# Patient Record
Sex: Male | Born: 1980 | Race: Black or African American | Hispanic: No | Marital: Single | Smoking: Current every day smoker
Health system: Southern US, Community
[De-identification: ages and names within clinical notes are randomized; demographics above are authoritative.]

## PROBLEM LIST (undated history)

## (undated) DIAGNOSIS — J40 Bronchitis, not specified as acute or chronic: Secondary | ICD-10-CM

## (undated) DIAGNOSIS — A150 Tuberculosis of lung: Secondary | ICD-10-CM

## (undated) HISTORY — PX: TONSILLECTOMY: SUR1361

---

## 2012-05-15 ENCOUNTER — Encounter (HOSPITAL_COMMUNITY): Payer: Self-pay

## 2012-05-15 ENCOUNTER — Emergency Department (HOSPITAL_COMMUNITY)
Admission: EM | Admit: 2012-05-15 | Discharge: 2012-05-15 | Disposition: A | Discharge status: Home / Self Care | Payer: Self-pay | Attending: Emergency Medicine | Admitting: Emergency Medicine

## 2012-05-15 DIAGNOSIS — L0291 Cutaneous abscess, unspecified: Secondary | ICD-10-CM

## 2012-05-15 DIAGNOSIS — L03211 Cellulitis of face: Secondary | ICD-10-CM | POA: Insufficient documentation

## 2012-05-15 DIAGNOSIS — F17200 Nicotine dependence, unspecified, uncomplicated: Secondary | ICD-10-CM | POA: Insufficient documentation

## 2012-05-15 DIAGNOSIS — L0201 Cutaneous abscess of face: Secondary | ICD-10-CM | POA: Insufficient documentation

## 2012-05-15 HISTORY — DX: Bronchitis, not specified as acute or chronic: J40

## 2012-05-15 MED ORDER — ALBUTEROL SULFATE HFA 108 (90 BASE) MCG/ACT IN AERS: 2.0000 | INHALATION_SPRAY | Freq: Once | RESPIRATORY_TRACT | Status: DC

## 2012-05-15 MED ORDER — AEROCHAMBER MAX W/MASK MEDIUM MISC
1.0000 | Freq: Once | Status: DC
  Filled 2012-05-15: qty 1

## 2012-05-15 MED ORDER — OXYCODONE-ACETAMINOPHEN 5-325 MG PO TABS: 1.0000 | ORAL_TABLET | ORAL | Status: AC | PRN

## 2012-05-15 NOTE — ED Notes (Signed)
Dressing applied to face covering packing.   Extra dressing material given for dressing changes.  Pt advised to remove packing in 5 days and to return as needed.

## 2012-05-15 NOTE — ED Provider Notes (Signed)
Medical screening examination/treatment/procedure(s) were performed by non-physician practitioner and as supervising physician I was immediately available for consultation/collaboration.  Flint Melter, MD 05/15/12 2132

## 2012-05-15 NOTE — ED Notes (Signed)
Pt also reports dry skin to bilateral feet, and pain to (L) thumb

## 2012-05-15 NOTE — ED Provider Notes (Signed)
History     CSN: [id number]  Arrival date & time 05/15/12  9528   First MD Initiated Contact with Patient 05/15/12 [phone]      Chief Complaint  Patient presents with  . Abscess    (Consider location/radiation/quality/duration/timing/severity/associated sxs/prior treatment) HPI Comments: Cristian Morton 31 y.o. male   The chief complaint is: Patient presents with:   Abscess    Patient with history of facial abscess presents today with abscess on left face. Patient had a cyst in the area.  He had his face trimmed last week at the barber shop . 4 days ago the cyst began to swell and become hot and red. Pain is throbbing, rates at 7/10, does not radiate.  The patient has poor dentition on that side but denies tooth pain or discharge in the mouth.  Denies difficulty breathin, or swallowing.  Denies trismus, ear pain or sore throat.Denies fevers, chills, myalgias, arthralgias, nausea, vomiting, diarrhea. Denies chest pain or abdominal pain.       The history is provided by the patient. No language interpreter was used.    Past Medical History  Diagnosis Date  . Bronchitis     Past Surgical History  Procedure Date  . Tonsillectomy     History reviewed. No pertinent family history.  History  Substance Use Topics  . Smoking status: Current Everyday Smoker  . Smokeless tobacco: Not on file  . Alcohol Use: Yes      Review of Systems  Constitutional: Negative for fever and chills.  HENT: Positive for facial swelling and dental problem. Negative for ear pain, sore throat, mouth sores, trouble swallowing, neck pain and neck stiffness.   Cardiovascular: Negative for chest pain.  Gastrointestinal: Negative for vomiting and abdominal pain.  Musculoskeletal: Negative for myalgias.  Skin: Positive for wound.    Allergies  Review of patient's allergies indicates no known allergies.  Home Medications  No current outpatient prescriptions on file.  BP 112/68  Pulse 73   Temp 99 F (37.2 C) (Oral)  Resp 18  SpO2 100%  Physical Exam  Nursing note and vitals reviewed. Constitutional: He is oriented to person, place, and time. He appears well-developed and well-nourished. No distress.  HENT:  Head: Normocephalic and atraumatic.  Mouth/Throat: Oropharynx is clear and moist.       5 cm swelling on right side of the face. Mass is warm and red. It is wholly fluctuant without induration into surrounding tissues.  Eyes: Conjunctivae are normal. No scleral icterus.  Neck: Normal range of motion. Neck supple.  Cardiovascular: Normal rate, regular rhythm and normal heart sounds.   Pulmonary/Chest: Effort normal. No respiratory distress.  Abdominal: Soft. He exhibits no distension. There is no tenderness.  Lymphadenopathy:    He has no cervical adenopathy.  Neurological: He is alert and oriented to person, place, and time.  Skin: Skin is warm and dry. He is not diaphoretic.  Psychiatric: His behavior is normal.    ED Course  Procedures (including critical care time) INCISION AND DRAINAGE Performed by: Arthor Captain Consent: Verbal consent obtained. Risks and benefits: risks, benefits and alternatives were discussed Type: abscess  Body area: right face  Anesthesia: local infiltration  Local anesthetic: lidocaine 2% without epinephrine  Anesthetic total: 3 ml  Complexity: complex Blunt dissection to break up loculations  Drainage: purulent, followed by sebaceus material  Drainage amount: 20 ml  Packing material: 1/4 in iodoform gauze  Patient tolerance: Patient tolerated the procedure well with no immediate complications.  BP 112/68  Pulse 73  Temp 99 F (37.2 C) (Oral)  Resp 18  SpO2 100%  Labs Reviewed - No data to display No results found.   1. Abscess       MDM  Patient with right facial abscess. Discussed care with Dr. Effie Shy and the patient. He will keep the packing in for 5 days and then remove.  He is instructed to keep  the area clean and change his dressing daily. Discussed reasons to seek immediate care. Patient expresses understanding and agrees with plan.         Arthor Captain, PA-C 05/15/12 2002

## 2012-05-15 NOTE — ED Notes (Signed)
Pt reports an abcess to inside (R) lower jaw x2 days, hx of the same,

## 2012-10-29 ENCOUNTER — Emergency Department (HOSPITAL_COMMUNITY): Payer: Self-pay

## 2012-10-29 ENCOUNTER — Encounter (HOSPITAL_COMMUNITY): Payer: Self-pay | Admitting: Emergency Medicine

## 2012-10-29 ENCOUNTER — Emergency Department (HOSPITAL_COMMUNITY)
Admission: EM | Admit: 2012-10-29 | Discharge: 2012-10-29 | Disposition: A | Discharge status: Home / Self Care | Payer: Self-pay | Attending: Emergency Medicine | Admitting: Emergency Medicine

## 2012-10-29 DIAGNOSIS — N509 Disorder of male genital organs, unspecified: Secondary | ICD-10-CM | POA: Insufficient documentation

## 2012-10-29 DIAGNOSIS — M549 Dorsalgia, unspecified: Secondary | ICD-10-CM | POA: Insufficient documentation

## 2012-10-29 DIAGNOSIS — Y929 Unspecified place or not applicable: Secondary | ICD-10-CM | POA: Insufficient documentation

## 2012-10-29 DIAGNOSIS — Y939 Activity, unspecified: Secondary | ICD-10-CM | POA: Insufficient documentation

## 2012-10-29 DIAGNOSIS — F17200 Nicotine dependence, unspecified, uncomplicated: Secondary | ICD-10-CM | POA: Insufficient documentation

## 2012-10-29 DIAGNOSIS — Z8709 Personal history of other diseases of the respiratory system: Secondary | ICD-10-CM | POA: Insufficient documentation

## 2012-10-29 DIAGNOSIS — R21 Rash and other nonspecific skin eruption: Secondary | ICD-10-CM | POA: Insufficient documentation

## 2012-10-29 DIAGNOSIS — N4889 Other specified disorders of penis: Secondary | ICD-10-CM

## 2012-10-29 DIAGNOSIS — Z8701 Personal history of pneumonia (recurrent): Secondary | ICD-10-CM | POA: Insufficient documentation

## 2012-10-29 DIAGNOSIS — X58XXXA Exposure to other specified factors, initial encounter: Secondary | ICD-10-CM | POA: Insufficient documentation

## 2012-10-29 DIAGNOSIS — S2092XA Blister (nonthermal) of unspecified parts of thorax, initial encounter: Secondary | ICD-10-CM | POA: Insufficient documentation

## 2012-10-29 DIAGNOSIS — B353 Tinea pedis: Secondary | ICD-10-CM | POA: Insufficient documentation

## 2012-10-29 HISTORY — DX: Tuberculosis of lung: A15.0

## 2012-10-29 LAB — RPR: RPR Ser Ql: NONREACTIVE

## 2012-10-29 LAB — HIV ANTIBODY (ROUTINE TESTING W REFLEX): HIV: NONREACTIVE

## 2012-10-29 MED ORDER — TOLNAFTATE 1 % EX CREA: TOPICAL_CREAM | Freq: Two times a day (BID) | CUTANEOUS | Status: DC

## 2012-10-29 MED ORDER — OXYCODONE-ACETAMINOPHEN 5-325 MG PO TABS
1.0000 | ORAL_TABLET | Freq: Four times a day (QID) | ORAL | Status: DC | PRN
Start: 2012-10-29 — End: 2013-03-26

## 2012-10-29 MED ORDER — SULFAMETHOXAZOLE-TRIMETHOPRIM 800-160 MG PO TABS: 1.0000 | ORAL_TABLET | Freq: Two times a day (BID) | ORAL | Status: AC

## 2012-10-29 NOTE — ED Notes (Signed)
C/o "athlete's feet" on both feet- dry skin on hands, and blister on testicle- dry skin noted on both feet- yellowish drainage between pinky and 4 th toe --  Blister on testicle started yesterday- denies and penile discharge, states has never had one before.
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
## 2012-12-06 ENCOUNTER — Encounter (HOSPITAL_COMMUNITY): Payer: Self-pay | Admitting: Emergency Medicine

## 2012-12-06 ENCOUNTER — Emergency Department (INDEPENDENT_AMBULATORY_CARE_PROVIDER_SITE_OTHER)
Admission: EM | Admit: 2012-12-06 | Discharge: 2012-12-06 | Disposition: A | Discharge status: Home / Self Care | Payer: Self-pay | Source: Home / Self Care | Attending: Emergency Medicine | Admitting: Emergency Medicine

## 2012-12-06 DIAGNOSIS — B86 Scabies: Secondary | ICD-10-CM

## 2012-12-06 MED ORDER — PERMETHRIN 5 % EX CREA
TOPICAL_CREAM | CUTANEOUS | Status: DC
Start: 2012-12-06 — End: 2013-03-26

## 2012-12-06 MED ORDER — HYDROXYZINE HCL 25 MG PO TABS: 25.0000 mg | ORAL_TABLET | Freq: Four times a day (QID) | ORAL | Status: DC
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
## 2012-12-06 NOTE — ED Notes (Signed)
Pt states that he has been using hydrocortisone cream with mild relief in itching.

## 2013-03-26 ENCOUNTER — Emergency Department (HOSPITAL_COMMUNITY): Payer: Self-pay

## 2013-03-26 ENCOUNTER — Encounter (HOSPITAL_COMMUNITY): Payer: Self-pay | Admitting: Emergency Medicine

## 2013-03-26 ENCOUNTER — Emergency Department (HOSPITAL_COMMUNITY)
Admission: EM | Admit: 2013-03-26 | Discharge: 2013-03-26 | Disposition: A | Discharge status: Home / Self Care | Payer: Self-pay | Attending: Emergency Medicine | Admitting: Emergency Medicine

## 2013-03-26 DIAGNOSIS — F17200 Nicotine dependence, unspecified, uncomplicated: Secondary | ICD-10-CM | POA: Insufficient documentation

## 2013-03-26 DIAGNOSIS — M25512 Pain in left shoulder: Secondary | ICD-10-CM

## 2013-03-26 DIAGNOSIS — S4980XA Other specified injuries of shoulder and upper arm, unspecified arm, initial encounter: Secondary | ICD-10-CM | POA: Insufficient documentation

## 2013-03-26 DIAGNOSIS — Z8709 Personal history of other diseases of the respiratory system: Secondary | ICD-10-CM | POA: Insufficient documentation

## 2013-03-26 DIAGNOSIS — S46909A Unspecified injury of unspecified muscle, fascia and tendon at shoulder and upper arm level, unspecified arm, initial encounter: Secondary | ICD-10-CM | POA: Insufficient documentation

## 2013-03-26 DIAGNOSIS — Z8611 Personal history of tuberculosis: Secondary | ICD-10-CM | POA: Insufficient documentation

## 2013-03-26 MED ORDER — METHOCARBAMOL 500 MG PO TABS: 500.0000 mg | ORAL_TABLET | Freq: Four times a day (QID) | ORAL | Status: DC | PRN

## 2013-03-26 MED ORDER — OXYCODONE-ACETAMINOPHEN 5-325 MG PO TABS
1.0000 | ORAL_TABLET | Freq: Once | ORAL | Status: AC
  Administered 2013-03-26: 1 via ORAL
  Filled 2013-03-26: qty 1

## 2013-03-26 MED ORDER — OXYCODONE-ACETAMINOPHEN 5-325 MG PO TABS: 2.0000 | ORAL_TABLET | Freq: Four times a day (QID) | ORAL | Status: DC | PRN

## 2013-03-26 NOTE — ED Notes (Signed)
Pt sts injured left shoulder 3 weeks ago and still having pain; pt sts increased pain with movement
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
## 2013-03-27 NOTE — ED Provider Notes (Signed)
Medical screening examination/treatment/procedure(s) were performed by non-physician practitioner and as supervising physician I was immediately available for consultation/collaboration.  Krystall Kruckenberg L Meilani Edmundson, MD 03/27/13 0050 

## 2013-07-08 ENCOUNTER — Emergency Department (HOSPITAL_COMMUNITY): Payer: Self-pay

## 2013-07-08 ENCOUNTER — Encounter (HOSPITAL_COMMUNITY): Payer: Self-pay

## 2013-07-08 ENCOUNTER — Emergency Department (HOSPITAL_COMMUNITY)
Admission: EM | Admit: 2013-07-08 | Discharge: 2013-07-08 | Disposition: A | Discharge status: Home / Self Care | Payer: Self-pay | Attending: Emergency Medicine | Admitting: Emergency Medicine

## 2013-07-08 DIAGNOSIS — W1789XA Other fall from one level to another, initial encounter: Secondary | ICD-10-CM | POA: Insufficient documentation

## 2013-07-08 DIAGNOSIS — Z8709 Personal history of other diseases of the respiratory system: Secondary | ICD-10-CM | POA: Insufficient documentation

## 2013-07-08 DIAGNOSIS — F17200 Nicotine dependence, unspecified, uncomplicated: Secondary | ICD-10-CM | POA: Insufficient documentation

## 2013-07-08 DIAGNOSIS — S62308A Unspecified fracture of other metacarpal bone, initial encounter for closed fracture: Secondary | ICD-10-CM

## 2013-07-08 DIAGNOSIS — Z8611 Personal history of tuberculosis: Secondary | ICD-10-CM | POA: Insufficient documentation

## 2013-07-08 DIAGNOSIS — Y939 Activity, unspecified: Secondary | ICD-10-CM | POA: Insufficient documentation

## 2013-07-08 DIAGNOSIS — Y92009 Unspecified place in unspecified non-institutional (private) residence as the place of occurrence of the external cause: Secondary | ICD-10-CM | POA: Insufficient documentation

## 2013-07-08 DIAGNOSIS — S62309A Unspecified fracture of unspecified metacarpal bone, initial encounter for closed fracture: Secondary | ICD-10-CM | POA: Insufficient documentation

## 2013-07-08 MED ORDER — OXYCODONE-ACETAMINOPHEN 5-325 MG PO TABS
2.0000 | ORAL_TABLET | Freq: Once | ORAL | Status: AC
  Administered 2013-07-08: 2 via ORAL
  Filled 2013-07-08: qty 2

## 2013-07-08 MED ORDER — OXYCODONE-ACETAMINOPHEN 5-325 MG PO TABS: 1.0000 | ORAL_TABLET | Freq: Four times a day (QID) | ORAL | Status: DC | PRN

## 2013-07-08 NOTE — ED Notes (Signed)
Pt states fell off the porch yesterday, swelling noted to rt hand/fingers, states took ibuprofen with no relief, took somebody's percocet and had some relief

## 2013-07-08 NOTE — ED Provider Notes (Signed)
CSN: [id number]     Arrival date & time 07/08/13  1055 History   First MD Initiated Contact with Patient 07/08/13 1110     Chief Complaint  Patient presents with  . Hand Pain   (Consider location/radiation/quality/duration/timing/severity/associated sxs/prior Treatment) HPI Comments: Patient presents emergency department with chief complaint of right hand pain. He states that he had a mechanical fall off his front porch. He states that he landed on his right hand. He endorses moderate to severe pain in his hand. He states that he tried taking ibuprofen, and his son's Percocet with some relief. She's also tried using warm compresses with some relief. States it hurts to bend his fingers.  The history is provided by the patient. No language interpreter was used.    Past Medical History  Diagnosis Date  . Bronchitis   . TB (pulmonary tuberculosis)     treated when was 32 y/o    Past Surgical History  Procedure Laterality Date  . Tonsillectomy     No family history on file. History  Substance Use Topics  . Smoking status: Current Every Day Smoker -- 0.50 packs/day  . Smokeless tobacco: Not on file  . Alcohol Use: Yes     Comment: 3 x/week    Review of Systems  All other systems reviewed and are negative.    Allergies  Review of patient's allergies indicates no known allergies.  Home Medications   Current Outpatient Rx  Name  Route  Sig  Dispense  Refill  . methocarbamol (ROBAXIN) 500 MG tablet   Oral   Take 1 tablet (500 mg total) by mouth 4 (four) times daily as needed.   20 tablet   0   . oxyCODONE-acetaminophen (PERCOCET/ROXICET) 5-325 MG per tablet   Oral   Take 2 tablets by mouth every 6 (six) hours as needed for pain (for severe pain).   5 tablet   0    BP 119/82  Pulse 64  Temp(Src) 99.3 F (37.4 C) (Oral)  Resp 20  SpO2 100% Physical Exam  Nursing note and vitals reviewed. Constitutional: He is oriented to person, place, and time. He appears  well-developed and well-nourished.  HENT:  Head: Normocephalic and atraumatic.  Eyes: Conjunctivae and EOM are normal.  Neck: Normal range of motion.  Cardiovascular: Normal rate and intact distal pulses.   Brisk capillary refill  Pulmonary/Chest: Effort normal.  Abdominal: He exhibits no distension.  Musculoskeletal: Normal range of motion.   Pain to palpation over the fourth and fifth metacarpals, as well as associated moderate swelling, no obvious bony abnormality or deformity  Neurological: He is alert and oriented to person, place, and time.  Skin: Skin is dry.  Psychiatric: He has a normal mood and affect. His behavior is normal. Judgment and thought content normal.    ED Course  Procedures (including critical care time) Labs Review Labs Reviewed - No data to display Imaging Review No results found.  MDM   1. Closed fracture of 5th metacarpal, initial encounter     Patient with right hand pain. Will check x-ray, and will reevaluate.  Patient with boxers fracture. Discussed patient with Dr. Rubin Payor, who recommends hand consultation. Discussed patient with Dr. Janee Morn from [facility] orthopedic, who recommends splinting, pain medicine and followup in his office. Discussed this with the patient, who understands and agrees with the plan. He is stable and ready for discharge.    Roxy Horseman, PA-C 07/08/13 [phone]

## 2013-07-08 NOTE — ED Provider Notes (Signed)
Medical screening examination/treatment/procedure(s) were performed by non-physician practitioner and as supervising physician I was immediately available for consultation/collaboration.  Brennley Curtice R. Fonnie Crookshanks, MD 07/08/13 1611 

## 2013-07-08 NOTE — Progress Notes (Signed)
[facility] provided pt with a [facility] [employer] Card application.  °

## 2014-01-18 ENCOUNTER — Emergency Department (HOSPITAL_COMMUNITY): Payer: Self-pay

## 2014-01-18 ENCOUNTER — Encounter (HOSPITAL_COMMUNITY): Payer: Self-pay | Admitting: Emergency Medicine

## 2014-01-18 ENCOUNTER — Emergency Department (HOSPITAL_COMMUNITY)
Admission: EM | Admit: 2014-01-18 | Discharge: 2014-01-18 | Disposition: A | Discharge status: Home / Self Care | Payer: Self-pay | Attending: Emergency Medicine | Admitting: Emergency Medicine

## 2014-01-18 DIAGNOSIS — IMO0002 Reserved for concepts with insufficient information to code with codable children: Secondary | ICD-10-CM | POA: Insufficient documentation

## 2014-01-18 DIAGNOSIS — W108XXA Fall (on) (from) other stairs and steps, initial encounter: Secondary | ICD-10-CM | POA: Insufficient documentation

## 2014-01-18 DIAGNOSIS — Z8611 Personal history of tuberculosis: Secondary | ICD-10-CM | POA: Insufficient documentation

## 2014-01-18 DIAGNOSIS — M549 Dorsalgia, unspecified: Secondary | ICD-10-CM

## 2014-01-18 DIAGNOSIS — Y929 Unspecified place or not applicable: Secondary | ICD-10-CM | POA: Insufficient documentation

## 2014-01-18 DIAGNOSIS — Z9889 Other specified postprocedural states: Secondary | ICD-10-CM | POA: Insufficient documentation

## 2014-01-18 DIAGNOSIS — S6990XA Unspecified injury of unspecified wrist, hand and finger(s), initial encounter: Secondary | ICD-10-CM | POA: Insufficient documentation

## 2014-01-18 DIAGNOSIS — Y9389 Activity, other specified: Secondary | ICD-10-CM | POA: Insufficient documentation

## 2014-01-18 DIAGNOSIS — Z8709 Personal history of other diseases of the respiratory system: Secondary | ICD-10-CM | POA: Insufficient documentation

## 2014-01-18 DIAGNOSIS — G8929 Other chronic pain: Secondary | ICD-10-CM | POA: Insufficient documentation

## 2014-01-18 DIAGNOSIS — F17200 Nicotine dependence, unspecified, uncomplicated: Secondary | ICD-10-CM | POA: Insufficient documentation

## 2014-01-18 DIAGNOSIS — R202 Paresthesia of skin: Secondary | ICD-10-CM

## 2014-01-18 MED ORDER — IBUPROFEN 400 MG PO TABS
800.0000 mg | ORAL_TABLET | Freq: Once | ORAL | Status: AC
  Administered 2014-01-18: 800 mg via ORAL
  Filled 2014-01-18: qty 2

## 2014-01-18 MED ORDER — CYCLOBENZAPRINE HCL 10 MG PO TABS: 10.0000 mg | ORAL_TABLET | Freq: Three times a day (TID) | ORAL | Status: DC | PRN

## 2014-01-18 MED ORDER — IBUPROFEN 800 MG PO TABS: 800.0000 mg | ORAL_TABLET | Freq: Three times a day (TID) | ORAL | Status: DC | PRN
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
## 2014-01-18 NOTE — Discharge Instructions (Signed)
Read the information below.  Use the prescribed medication as directed.  Please discuss all new medications with your pharmacist.  You may return to the Emergency Department at any time for worsening condition or any new symptoms that concern you.   If you develop fevers, loss of control of bowel or bladder, weakness or numbness in your legs, or are unable to walk, return to the ER for a recheck.    Back Exercises Back exercises help treat and prevent back injuries. The goal of back exercises is to increase the strength of your abdominal and back muscles and the flexibility of your back. These exercises should be started when you no longer have back pain. Back exercises include:  Pelvic Tilt. Lie on your back with your knees bent. Tilt your pelvis until the lower part of your back is against the floor. Hold this position 5 to 10 sec and repeat 5 to 10 times.  Knee to Chest. Pull first 1 knee up against your chest and hold for 20 to 30 seconds, repeat this with the other knee, and then both knees. This may be done with the other leg straight or bent, whichever feels better.  Sit-Ups or Curl-Ups. Bend your knees 90 degrees. Start with tilting your pelvis, and do a partial, slow sit-up, lifting your trunk only 30 to 45 degrees off the floor. Take at least 2 to 3 seconds for each sit-up. Do not do sit-ups with your knees out straight. If partial sit-ups are difficult, simply do the above but with only tightening your abdominal muscles and holding it as directed.  Hip-Lift. Lie on your back with your knees flexed 90 degrees. Push down with your feet and shoulders as you raise your hips a couple inches off the floor; hold for 10 seconds, repeat 5 to 10 times.  Back arches. Lie on your stomach, propping yourself up on bent elbows. Slowly press on your hands, causing an arch in your low back. Repeat 3 to 5 times. Any initial stiffness and discomfort should lessen with repetition over time.  Shoulder-Lifts.  Lie face down with arms beside your body. Keep hips and torso pressed to floor as you slowly lift your head and shoulders off the floor. Do not overdo your exercises, especially in the beginning. Exercises may cause you some mild back discomfort which lasts for a few minutes; however, if the pain is more severe, or lasts for more than 15 minutes, do not continue exercises until you see your caregiver. Improvement with exercise therapy for back problems is slow.  See your caregivers for assistance with developing a proper back exercise program. Document Released: 11/01/2004 Document Revised: 12/17/2011 Document Reviewed: 07/26/2011 [facility] Patient Information 2014 [place], [place].  Back Pain, Adult Low back pain is very common. About 1 in 5 people have back pain.The cause of low back pain is rarely dangerous. The pain often gets better over time.About half of people with a sudden onset of back pain feel better in just 2 weeks. About 8 in 10 people feel better by 6 weeks.  CAUSES Some common causes of back pain include:  Strain of the muscles or ligaments supporting the spine.  Wear and tear (degeneration) of the spinal discs.  Arthritis.  Direct injury to the back. DIAGNOSIS Most of the time, the direct cause of low back pain is not known.However, back pain can be treated effectively even when the exact cause of the pain is unknown.Answering your caregiver's questions about your overall health and  symptoms is one of the most accurate ways to make sure the cause of your pain is not dangerous. If your caregiver needs more information, he or she may order lab work or imaging tests (X-rays or MRIs).However, even if imaging tests show changes in your back, this usually does not require surgery. HOME CARE INSTRUCTIONS For many people, back pain returns.Since low back pain is rarely dangerous, it is often a condition that people can learn to [facility] their own.   Remain active. It is stressful  on the back to sit or stand in one place. Do not sit, drive, or stand in one place for more than 30 minutes at a time. Take short walks on level surfaces as soon as pain allows.Try to increase the length of time you walk each day.  Do not stay in bed.Resting more than 1 or 2 days can delay your recovery.  Do not avoid exercise or work.Your body is made to move.It is not dangerous to be active, even though your back may hurt.Your back will likely heal faster if you return to being active before your pain is gone.  Pay attention to your body when you bend and lift. Many people have less discomfortwhen lifting if they bend their knees, keep the load close to their bodies,and avoid twisting. Often, the most comfortable positions are those that put less stress on your recovering back.  Find a comfortable position to sleep. Use a firm mattress and lie on your side with your knees slightly bent. If you lie on your back, put a pillow under your knees.  Only take over-the-counter or prescription medicines as directed by your caregiver. Over-the-counter medicines to reduce pain and inflammation are often the most helpful.Your caregiver may prescribe muscle relaxant drugs.These medicines help dull your pain so you can more quickly return to your normal activities and healthy exercise.  Put ice on the injured area.  Put ice in a plastic bag.  Place a towel between your skin and the bag.  Leave the ice on for 15-20 minutes, 03-04 times a day for the first 2 to 3 days. After that, ice and heat may be alternated to reduce pain and spasms.  Ask your caregiver about trying back exercises and gentle massage. This may be of some benefit.  Avoid feeling anxious or stressed.Stress increases muscle tension and can worsen back pain.It is important to recognize when you are anxious or stressed and learn ways to manage it.Exercise is a great option. SEEK MEDICAL CARE IF:  You have pain that is not  relieved with rest or medicine.  You have pain that does not improve in 1 week.  You have new symptoms.  You are generally not feeling well. SEEK IMMEDIATE MEDICAL CARE IF:   You have pain that radiates from your back into your legs.  You develop new bowel or bladder control problems.  You have unusual weakness or numbness in your arms or legs.  You develop nausea or vomiting.  You develop abdominal pain.  You feel faint. Document Released: 09/24/2005 Document Revised: 03/25/2012 Document Reviewed: 02/12/2011 [facility] Patient Information 2014 [place], [place].  Chronic Pain Chronic pain can be defined as pain that is off and on and lasts for 3 6 months or longer. Many things cause chronic pain, which can make it difficult to make a diagnosis. There are many treatment options available for chronic pain. However, finding a treatment that works well for you may require trying various approaches until the right one  is found. Many people benefit from a combination of two or more types of treatment to control their pain. SYMPTOMS  Chronic pain can occur anywhere in the body and can range from mild to very severe. Some types of chronic pain include:  Headache.  Low back pain.  Cancer pain.  Arthritis pain.  Neurogenic pain. This is pain resulting from damage to nerves. People with chronic pain may also have other symptoms such as:  Depression.  Anger.  Insomnia.  Anxiety. DIAGNOSIS  Your health care provider will help diagnose your condition over time. In many cases, the initial focus will be on excluding possible conditions that could be causing the pain. Depending on your symptoms, your health care provider may order tests to diagnose your condition. Some of these tests may include:   Blood tests.   CT scan.   MRI.   X-rays.   Ultrasounds.   Nerve conduction studies.  You may need to see a specialist.  TREATMENT  Finding treatment that works well may  take time. You may be referred to a pain specialist. He or she may prescribe medicine or therapies, such as:   Mindful meditation or yoga.  Shots (injections) of numbing or pain-relieving medicines into the spine or area of pain.  Local electrical stimulation.  Acupuncture.   Massage therapy.   Aroma, color, light, or sound therapy.   Biofeedback.   Working with a physical therapist to keep from getting stiff.   Regular, gentle exercise.   Cognitive or behavioral therapy.   Group support.  Sometimes, surgery may be recommended.  HOME CARE INSTRUCTIONS   Take all medicines as directed by your health care provider.   Lessen stress in your life by relaxing and doing things such as listening to calming music.   Exercise or be active as directed by your health care provider.   Eat a healthy diet and include things such as vegetables, fruits, fish, and lean meats in your diet.   Keep all follow-up appointments with your health care provider.   Attend a support group with others suffering from chronic pain. SEEK MEDICAL CARE IF:   Your pain gets worse.   You develop a new pain that was not there before.   You cannot tolerate medicines given to you by your health care provider.   You have new symptoms since your last visit with your health care provider.  SEEK IMMEDIATE MEDICAL CARE IF:   You feel weak.   You have decreased sensation or numbness.   You lose control of bowel or bladder function.   Your pain suddenly gets much worse.   You develop shaking.  You develop chills.  You develop confusion.  You develop chest pain.  You develop shortness of breath.  MAKE SURE YOU:  Understand these instructions.  Will watch your condition.  Will get help right away if you are not doing well or get worse. Document Released: 06/16/2002 Document Revised: 05/27/2013 Document Reviewed: 03/20/2013 [facility] Patient Information 2014 [place],  [place].  Paresthesia Paresthesia is an abnormal burning or prickling sensation. This sensation is generally felt in the hands, arms, legs, or feet. However, it may occur in any part of the body. It is usually not painful. The feeling may be described as:  Tingling or numbness.  "Pins and needles."  Skin crawling.  Buzzing.  Limbs "falling asleep."  Itching. Most people experience temporary (transient) paresthesia at some time in their lives. CAUSES  Paresthesia may occur when you breathe  too quickly (hyperventilation). It can also occur without any apparent cause. Commonly, paresthesia occurs when pressure is placed on a nerve. The feeling quickly goes away once the pressure is removed. For some people, however, paresthesia is a long-lasting (chronic) condition caused by an underlying disorder. The underlying disorder may be:  A traumatic, direct injury to nerves. Examples include a:  Broken (fractured) neck.  Fractured skull.  A disorder affecting the brain and spinal cord (central nervous system). Examples include:  Transverse myelitis.  Encephalitis.  Transient ischemic attack.  Multiple sclerosis.  Stroke.  Tumor or blood vessel problems, such as an arteriovenous malformation pressing against the brain or spinal cord.  A condition that damages the peripheral nerves (peripheral neuropathy). Peripheral nerves are not part of the brain and spinal cord. These conditions include:  Diabetes.  Peripheral vascular disease.  Nerve entrapment syndromes, such as carpal tunnel syndrome.  Shingles.  Hypothyroidism.  Vitamin B12 deficiencies.  Alcoholism.  Heavy metal poisoning (lead, arsenic).  Rheumatoid arthritis.  Systemic lupus erythematosus. DIAGNOSIS  Your caregiver will attempt to find the underlying cause of your paresthesia. Your caregiver may:  Take your medical history.  Perform a physical exam.  Order various lab tests.  Order imaging  tests. TREATMENT  Treatment for paresthesia depends on the underlying cause. HOME CARE INSTRUCTIONS  Avoid drinking alcohol.  You may consider massage or acupuncture to help relieve your symptoms.  Keep all follow-up appointments as directed by your caregiver. SEEK IMMEDIATE MEDICAL CARE IF:   You feel weak.  You have trouble walking or moving.  You have problems with speech or vision.  You feel confused.  You cannot control your bladder or bowel movements.  You feel numbness after an injury.  You faint.  Your burning or prickling feeling gets worse when walking.  You have pain, cramps, or dizziness.  You develop a rash. MAKE SURE YOU:  Understand these instructions.  Will watch your condition.  Will get help right away if you are not doing well or get worse. Document Released: 09/14/2002 Document Revised: 12/17/2011 Document Reviewed: 06/15/2011 [facility] Patient Information 2014 [place], [place].    Emergency Department Resource Guide 1) Find a Doctor and Pay Out of Pocket Although you won't have to find out who is covered by your insurance plan, it is a good idea to ask around and get recommendations. You will then need to call the office and see if the doctor you have chosen will accept you as a new patient and what types of options they offer for patients who are self-pay. Some doctors offer discounts or will set up payment plans for their patients who do not have insurance, but you will need to ask so you aren't surprised when you get to your appointment.  2) Contact Your Local Health Department Not all health departments have doctors that can see patients for sick visits, but many do, so it is worth a call to see if yours does. If you don't know where your local health department is, you can check in your phone book. The CDC also has a tool to help you locate your state's health department, and many state websites also have listings of all of their local health  departments.  3) Find a Walk-in Clinic If your illness is not likely to be very severe or complicated, you may want to try a walk in clinic. These are popping up all over the country in pharmacies, drugstores, and shopping centers. They're usually staffed by nurse  practitioners or physician assistants that have been trained to treat common illnesses and complaints. They're usually fairly quick and inexpensive. However, if you have serious medical issues or chronic medical problems, these are probably not your best option.  No Primary Care Doctor: - Call Health Connect at  [phone] - they can help you locate a primary care doctor that  accepts your insurance, provides certain services, etc. - Physician Referral Service- [phone]  Chronic Pain Problems: Organization         Address  Phone   Notes  [facility]  [phone] Patients need to be referred by their primary care doctor.   Medication Assistance: Organization         Address  Phone   Notes  [facility] Medication [facility] [address] [place] [place], [place] [address] [phone] --Must be a resident of [facility] -- Must have NO insurance coverage whatsoever (no Medicaid/ Medicare, etc.) -- The pt. MUST have a primary care doctor that directs their care regularly and follows them in the community   [facility]  [phone]   [employer]  [phone]    Agencies that provide inexpensive medical care: Organization         Address  Phone   Notes  [facility]  [phone]   [facility]    [phone]   [facility] [address] [place], [place] [address] [phone]   [facility] [address] [place], [place] [phone]   Planned Parenthood    [phone]   [facility]    [phone]   [facility]  [address], [place] Phone:  [phone], Fax:  [phone] Hours of Operation:  9 am - 6 pm, M-F.  Also accepts Medicaid/Medicare and self-pay.  [facility]  [address], [place] Phone: [phone], Fax: [phone]. Hours of Operation:  8:30 am - 5:30 pm, M-F.  Also accepts Medicaid and self-pay.  [facility] [place] [address] [place] Phone: [phone]   [facility] [address] Natasha Bence [place], [place] [phone], Ext. [phone] Mondays & Thursdays: 7-9 AM.  First 15 patients are seen on a first come, first serve basis.    Medicaid-accepting [facility] Providers:  Organization         Address  Phone   Notes  [facility] [address], [place] [phone] Also accepts self-pay patients.  [facility] [address] Laurell Josephs [place], [place]  [phone]   [facility] [address], [place] [phone]   [facility] [address] [place] [phone]   Renaye Rakers [address], [place]   [phone] Only accepts [place] Access [place] patients after they have their name applied to their card.   Self-Pay (no insurance) in [facility]:  Organization         Address  Phone   Notes  Sickle Cell Patients, [facility] [address] [place], [place] [phone]   [facility] [address] [place], [place] [phone]   [facility] [place]  [address], [place] [phone]   [facility]/Dr. Julio Sicks  [phone]  [place], [place] or [address], [place] [phone] Phone number for both [place] and [place] locations is the same.  [facility] [address] [place] [phone]   [facility] [place] [facility] [address], [place] or [address] [phone]   [facility] [address], [place] [phone], phone; [phone], fax Sees patients 1st and 3rd Saturday of every month.  Must not qualify for public or private insurance (i.e. Medicaid, Medicare, [place] Health Choice, Veterans' Benefits)  Household income should be no more than 200% of the poverty level The clinic cannot treat you if you are pregnant or think you are pregnant  Sexually transmitted diseases are not treated at the clinic.    Dental Care: Organization         Address  Phone  Notes  [facility] Department of [facility] [address] [place], [place] [phone] Accepts children up to age 72 who are enrolled in [place] or [place] Health Choice; pregnant women with a Medicaid card; and children who have applied for Medicaid or [place] Health Choice, but were declined, whose parents can pay a reduced fee at time of service.  [facility]  [address], [place] [phone] Accepts children up to age 69 who are enrolled in [place] or [place] Health Choice; pregnant women with a Medicaid card; and children who have applied for Medicaid or [place] Health Choice, but were declined, whose parents can pay a reduced fee at time of service.  [facility]  [address] [place], [place] [phone] Patients are seen by appointment only. Walk-ins are not accepted. [facility] will see patients 11 years of age and older. Monday - Tuesday (8am-5pm) Most Wednesdays (8:30-5pm) $30 per visit, cash only  [facility]  [address], [facility] [phone] Patients are seen by appointment only. Walk-ins are not accepted. [facility] will see patients 82 years of age and older. One Wednesday Evening (Monthly: Volunteer Based).  $30 per visit, cash only  [employer]  [phone] for adults;  Children under age 74, call Graduate Pediatric Dentistry at [phone]. Children aged 47-14, please call [phone] to request a pediatric application.  Dental services are provided in all areas of dental care including fillings, crowns and bridges, complete and partial dentures, implants, gum treatment, root canals, and extractions. Preventive care is also provided. Treatment is provided to both adults and children. Patients are selected via a lottery and there is often a waiting list.   [facility] [place] [address], [place]  [phone] [email or web address]   [facility] [address] [place], [place] [phone], Ext. [phone] Second and Fourth Thursday of each month, opens at 6:30 AM; Clinic ends at 9 AM.  Patients are seen on a first-come first-served basis, and a limited number are seen during each clinic.   [facility]  [address] Ether Griffins [place], [place] [phone]   Eligibility Requirements You must have lived in [place], [place], or [place] counties for at least the last three months.   You cannot be eligible for state or federal sponsored [place], including [employer], [place], or [employer].   You generally cannot be eligible for healthcare insurance through your employer.  How to apply: Eligibility screenings are held every Tuesday and Wednesday afternoon from 1:00 pm until 4:00 pm. You do not need an appointment for the interview!  [facility] [address] [place], [place] [phone]   [facility] Health Department  [phone]   [facility] Health Department  [phone]   [facility] Health Department  [phone]    Behavioral Health Resources in the Community: Intensive Outpatient Programs Organization         Address  Phone  Notes  [facility] Services [address], [place], [place] [phone]   [facility] Outpatient [address] [place], [place] [phone]   [facility] [address] [place], [place]  [phone]   [facility] Mental Health [address],  [place], [place] [phone] or [phone]   Substance Abuse Resources Organization         Address  Phone  Notes  [facility]  [phone]   [facility]  [phone]   [facility] [place]  [phone]   Floydene Flock  [phone]   Residential & Outpatient Substance Abuse Program  [phone]   Psychological Services Organization         Address  Phone  Notes  [facility] Behavioral Health  [phone]   [facility] Services  [phone]   [facility] [address], [place] [phone] or [phone]    Mobile Crisis Teams Organization         Address  Phone  Notes  [facility], Mobile Crisis Care Unit  [phone]   [facility]  [address] [place], [place] [phone]   Doristine Locks [address] [place] [phone]    Self-Help/Support Groups Organization         Address  Phone             Notes  [facility] [place] - variety of support groups  336- I7437963 Call for more information  Narcotics Anonymous (NA), [facility] [address], [employer] [place] Gayatri [place]  2 meetings at this location   Statistician         Address  Phone  Notes  [facility] [address],    [place]  [phone]   [facility]  [address], [place] [address], [place], [place] [phone]   [facility] [address] [place], [place] [phone] Admissions: 8am-3pm M-F  [facility] [address]    [place], [place] [phone]   [facility] [address] [place], [place], [place] [phone]   [facility] [address],  [place], [place] [phone]   [facility] - Intensive  Outpatient [address] [address], [place], [place] [phone]   [facility] [address]  [place], [place] [phone] or [phone]   [facility] [address] [place], [place] [phone] Accepts Medicaid  [facility] [address],  [place] [phone] Substance Abuse/Addiction Treatment   [facility] Organization         Address  Phone  Notes  [facility]  [phone]   Angie Fava, PhD [address] Ervin Knack [place], [place]   [phone] or [phone]   [facility]   [address] [place], [place] [phone]   [facility] [address], [place], [place] [phone]) [phone] Insurance/Medicaid/sponsorship through [employer]  [facility] [address]                                    [place], [place] [phone]) [phone] Therapy/tele-psych/case  [facility] [address]   [place], [place] [phone]) [phone]    Dr. Lolly MustacheArfeen  [phone]) [phone]   [facility]  [facility] 1) [address], [place] 2) [address], [place] 3)  [address], [place] [phone]) [phone]) [phone]  [phone]) [phone]   [facility] Hot[facility] [phone]) [phone] or [phone]) [phone] (After Hours)
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
## 2014-01-21 NOTE — ED Provider Notes (Signed)
Medical screening examination/treatment/procedure(s) were performed by non-physician practitioner and as supervising physician I was immediately available for consultation/collaboration.  Jessah Danser, MD 01/21/14 0716 

## 2014-05-09 ENCOUNTER — Emergency Department (HOSPITAL_COMMUNITY)
Admission: EM | Admit: 2014-05-09 | Discharge: 2014-05-09 | Disposition: A | Discharge status: Home / Self Care | Payer: No Typology Code available for payment source | Attending: Emergency Medicine | Admitting: Emergency Medicine

## 2014-05-09 ENCOUNTER — Encounter (HOSPITAL_COMMUNITY): Payer: Self-pay | Admitting: Emergency Medicine

## 2014-05-09 DIAGNOSIS — Z791 Long term (current) use of non-steroidal anti-inflammatories (NSAID): Secondary | ICD-10-CM | POA: Insufficient documentation

## 2014-05-09 DIAGNOSIS — S46812A Strain of other muscles, fascia and tendons at shoulder and upper arm level, left arm, initial encounter: Secondary | ICD-10-CM

## 2014-05-09 DIAGNOSIS — Z8611 Personal history of tuberculosis: Secondary | ICD-10-CM | POA: Insufficient documentation

## 2014-05-09 DIAGNOSIS — Y92410 Unspecified street and highway as the place of occurrence of the external cause: Secondary | ICD-10-CM | POA: Insufficient documentation

## 2014-05-09 DIAGNOSIS — F17200 Nicotine dependence, unspecified, uncomplicated: Secondary | ICD-10-CM | POA: Insufficient documentation

## 2014-05-09 DIAGNOSIS — S335XXA Sprain of ligaments of lumbar spine, initial encounter: Secondary | ICD-10-CM | POA: Insufficient documentation

## 2014-05-09 DIAGNOSIS — S46819A Strain of other muscles, fascia and tendons at shoulder and upper arm level, unspecified arm, initial encounter: Principal | ICD-10-CM

## 2014-05-09 DIAGNOSIS — Z043 Encounter for examination and observation following other accident: Secondary | ICD-10-CM | POA: Insufficient documentation

## 2014-05-09 DIAGNOSIS — S39012A Strain of muscle, fascia and tendon of lower back, initial encounter: Secondary | ICD-10-CM

## 2014-05-09 DIAGNOSIS — Y9389 Activity, other specified: Secondary | ICD-10-CM | POA: Insufficient documentation

## 2014-05-09 DIAGNOSIS — S43499A Other sprain of unspecified shoulder joint, initial encounter: Secondary | ICD-10-CM | POA: Insufficient documentation

## 2014-05-09 MED ORDER — NAPROXEN 500 MG PO TABS: 500.0000 mg | ORAL_TABLET | Freq: Two times a day (BID) | ORAL | Status: DC

## 2014-05-09 MED ORDER — HYDROCODONE-ACETAMINOPHEN 5-325 MG PO TABS: 1.0000 | ORAL_TABLET | Freq: Four times a day (QID) | ORAL | Status: DC | PRN

## 2014-05-09 MED ORDER — METHOCARBAMOL 1000 MG/10ML IJ SOLN: 1000.0000 mg | Freq: Once | INTRAMUSCULAR | Status: DC

## 2014-05-09 MED ORDER — METHOCARBAMOL 500 MG PO TABS: 500.0000 mg | ORAL_TABLET | Freq: Two times a day (BID) | ORAL | Status: DC

## 2014-05-09 MED ORDER — HYDROCODONE-ACETAMINOPHEN 5-325 MG PO TABS
2.0000 | ORAL_TABLET | Freq: Once | ORAL | Status: AC
  Administered 2014-05-09: 2 via ORAL
  Filled 2014-05-09: qty 2
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
## 2014-05-09 NOTE — ED Notes (Signed)
Patient repeatedly coming out to desk asking for pain medication ED PA has been made aware and patient has been informed several times that the PA is aware and that he needs to wait for the PA to place an order for medication Patient upset, walking back to room cursing and can be heard in room cursing Patient able to ambulate without difficulty or assistance Amie, RN Charge Nurse aware of patient behavior

## 2014-05-09 NOTE — ED Provider Notes (Signed)
Medical screening examination/treatment/procedure(s) were performed by non-physician practitioner and as supervising physician I was immediately available for consultation/collaboration.   EKG Interpretation None       Ethelda ChickMartha K Linker, MD 05/09/14 2232
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
## 2014-05-09 NOTE — ED Notes (Signed)
Patient ambulated to desk, asking for pain medication ED PA currently in another room placing sutures Patient informed that PA is currently seeing another patient, but will be made aware of his request Patient ambulated back to room without difficulty

## 2014-05-09 NOTE — ED Notes (Signed)
Patient did not want to wait for Robaxin IM to be approved/verified by Pharmacy--wants DC papers

## 2014-05-12 ENCOUNTER — Encounter (HOSPITAL_COMMUNITY): Payer: Self-pay | Admitting: Emergency Medicine

## 2014-05-12 ENCOUNTER — Emergency Department (INDEPENDENT_AMBULATORY_CARE_PROVIDER_SITE_OTHER)
Admission: EM | Admit: 2014-05-12 | Discharge: 2014-05-12 | Disposition: A | Discharge status: Home / Self Care | Payer: Self-pay | Source: Home / Self Care | Attending: Emergency Medicine | Admitting: Emergency Medicine

## 2014-05-12 ENCOUNTER — Emergency Department (INDEPENDENT_AMBULATORY_CARE_PROVIDER_SITE_OTHER): Payer: Self-pay

## 2014-05-12 DIAGNOSIS — S139XXA Sprain of joints and ligaments of unspecified parts of neck, initial encounter: Secondary | ICD-10-CM

## 2014-05-12 DIAGNOSIS — S161XXA Strain of muscle, fascia and tendon at neck level, initial encounter: Secondary | ICD-10-CM

## 2014-05-12 DIAGNOSIS — Y92410 Unspecified street and highway as the place of occurrence of the external cause: Secondary | ICD-10-CM

## 2014-05-12 DIAGNOSIS — S335XXA Sprain of ligaments of lumbar spine, initial encounter: Secondary | ICD-10-CM

## 2014-05-12 DIAGNOSIS — S39012A Strain of muscle, fascia and tendon of lower back, initial encounter: Secondary | ICD-10-CM

## 2014-05-12 MED ORDER — OXYCODONE-ACETAMINOPHEN 5-325 MG PO TABS: ORAL_TABLET | ORAL | Status: DC

## 2014-05-12 MED ORDER — MELOXICAM 15 MG PO TABS: 15.0000 mg | ORAL_TABLET | Freq: Every day | ORAL | Status: DC

## 2014-05-12 MED ORDER — METHOCARBAMOL 500 MG PO TABS: 500.0000 mg | ORAL_TABLET | Freq: Three times a day (TID) | ORAL | Status: DC
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
## 2014-05-12 NOTE — Discharge Instructions (Signed)
TREATMENT  °Treatment initially involves the use of ice and medication to help reduce pain and inflammation. It is also important to perform strengthening and stretching exercises and modify activities that worsen symptoms so the injury does not get worse. These exercises may be performed at home or with a therapist. For patients who experience severe symptoms, a soft padded collar may be recommended to be worn around the neck.  °Improving your posture may help reduce symptoms. Posture improvement includes pulling your chin and abdomen in while sitting or standing. If you are sitting, sit in a firm chair with your buttocks against the back of the chair. While sleeping, try replacing your pillow with a small towel rolled to 2 inches in diameter, or use a cervical pillow. Poor sleeping positions delay healing.  ° °MEDICATION  °· If pain medication is necessary, nonsteroidal anti-inflammatory medications, such as aspirin and ibuprofen, or other minor pain relievers, such as acetaminophen, are often recommended. °· Do not take pain medication for 7 days before surgery. °· Prescription pain relievers may be given if deemed necessary by your caregiver. Use only as directed and only as much as you need. ° °HEAT AND COLD:  °· Cold treatment (icing) relieves pain and reduces inflammation. Cold treatment should be applied for 10 to 15 minutes every 2 to 3 hours for inflammation and pain and immediately after any activity that aggravates your symptoms. Use ice packs or an ice massage. °· Heat treatment may be used prior to performing the stretching and strengthening activities prescribed by your caregiver, physical therapist, or athletic trainer. Use a heat pack or a warm soak. ° °SEEK MEDICAL CARE IF:  °· Symptoms get worse or do not improve in 2 weeks despite treatment. °· New, unexplained symptoms develop (drugs used in treatment may produce side effects). ° °EXERCISES °RANGE OF MOTION (ROM) AND STRETCHING EXERCISES -  Cervical Strain and Sprain °These exercises may help you when beginning to rehabilitate your injury. In order to successfully resolve your symptoms, you must improve your posture. These exercises are designed to help reduce the forward-head and rounded-shoulder posture which contributes to this condition. Your symptoms may resolve with or without further involvement from your physician, physical therapist or athletic trainer. While completing these exercises, remember:  °· Restoring tissue flexibility helps normal motion to return to the joints. This allows healthier, less painful movement and activity. °· An effective stretch should be held for at least 20 seconds, although you may need to begin with shorter hold times for comfort. °· A stretch should never be painful. You should only feel a gentle lengthening or release in the stretched tissue. ° °STRETCH- Axial Extensors °· Lie on your back on the floor. You may bend your knees for comfort. Place a rolled up hand towel or dish towel, about 2 inches in diameter, under the part of your head that makes contact with the floor. °· Gently tuck your chin, as if trying to make a "double chin," until you feel a gentle stretch at the base of your head. °· Hold _____10_____ seconds. °Repeat _____10_____ times. Complete this exercise _____2_____ times per day.  ° °STRETECH - Axial Extension  °· Stand or sit on a firm surface. Assume a good posture: chest up, shoulders drawn back, abdominal muscles slightly tense, knees unlocked (if standing) and feet hip width apart. °· Slowly retract your chin so your head slides back and your chin slightly lowers.Continue to look straight ahead. °· You should feel a gentle stretch   in the back of your head. Be certain not to feel an aggressive stretch since this can cause headaches later. °· Hold for ____10______ seconds. °Repeat _____10_____ times. Complete this exercise ____2______ times per day. ° °STRETCH  Cervical Side Bend  °· Stand  or sit on a firm surface. Assume a good posture: chest up, shoulders drawn back, abdominal muscles slightly tense, knees unlocked (if standing) and feet hip width apart. °· Without letting your nose or shoulders move, slowly tip your right / left ear to your shoulder until your feel a gentle stretch in the muscles on the opposite side of your neck. °· Hold _____10_____ seconds. °Repeat _____10_____ times. Complete this exercise _____2_____ times per day. ° °STRETCH  Cervical Rotators  °· Stand or sit on a firm surface. Assume a good posture: chest up, shoulders drawn back, abdominal muscles slightly tense, knees unlocked (if standing) and feet hip width apart. °· Keeping your eyes level with the ground, slowly turn your head until you feel a gentle stretch along the back and opposite side of your neck. °· Hold _____10_____ seconds. °Repeat ____10______ times. Complete this exercise ____2______ times per day. ° °RANGE OF MOTION - Neck Circles  °· Stand or sit on a firm surface. Assume a good posture: chest up, shoulders drawn back, abdominal muscles slightly tense, knees unlocked (if standing) and feet hip width apart. °· Gently roll your head down and around from the back of one shoulder to the back of the other. The motion should never be forced or painful. °· Repeat the motion 10-20 times, or until you feel the neck muscles relax and loosen. °Repeat ____10______ times. Complete the exercise _____2_____ times per day. ° °STRENGTHENING EXERCISES - Cervical Strain and Sprain °These exercises may help you when beginning to rehabilitate your injury. They may resolve your symptoms with or without further involvement from your physician, physical therapist or athletic trainer. While completing these exercises, remember:  °· Muscles can gain both the endurance and the strength needed for everyday activities through controlled exercises. °· Complete these exercises as instructed by your physician, physical therapist or  athletic trainer. Progress the resistance and repetitions only as guided. °· You may experience muscle soreness or fatigue, but the pain or discomfort you are trying to eliminate should never worsen during these exercises. If this pain does worsen, stop and make certain you are following the directions exactly. If the pain is still present after adjustments, discontinue the exercise until you can discuss the trouble with your clinician. ° °STRENGTH Cervical Flexors, Isometric °· Face a wall, standing about 6 inches away. Place a small pillow, a ball about 6-8 inches in diameter, or a folded towel between your forehead and the wall. °· Slightly tuck your chin and gently push your forehead into the soft object. Push only with mild to moderate intensity, building up tension gradually. Keep your jaw and forehead relaxed. °· Hold 10 to 20 seconds. Keep your breathing relaxed. °· Release the tension slowly. Relax your neck muscles completely before you start the next repetition. °Repeat _____10_____ times. Complete this exercise _____2_____ times per day. ° °STRENGTH- Cervical Lateral Flexors, Isometric  °· Stand about 6 inches away from a wall. Place a small pillow, a ball about 6-8 inches in diameter, or a folded towel between the side of your head and the wall. °· Slightly tuck your chin and gently tilt your head into the soft object. Push only with mild to moderate intensity, building up tension gradually. Keep   your jaw and forehead relaxed. °· Hold 10 to 20 seconds. Keep your breathing relaxed. °· Release the tension slowly. Relax your neck muscles completely before you start the next repetition. °Repeat _____10_____ times. Complete this exercise ____2______ times per day. ° °STRENGTH  Cervical Extensors, Isometric  °· Stand about 6 inches away from a wall. Place a small pillow, a ball about 6-8 inches in diameter, or a folded towel between the back of your head and the wall. °· Slightly tuck your chin and gently  tilt your head back into the soft object. Push only with mild to moderate intensity, building up tension gradually. Keep your jaw and forehead relaxed. °· Hold 10 to 20 seconds. Keep your breathing relaxed. °· Release the tension slowly. Relax your neck muscles completely before you start the next repetition. °Repeat _____10_____ times. Complete this exercise _____2_____ times per day. ° °POSTURE AND BODY MECHANICS CONSIDERATIONS - Cervical Strain and Sprain °Keeping correct posture when sitting, standing or completing your activities will reduce the stress put on different body tissues, allowing injured tissues a chance to heal and limiting painful experiences. The following are general guidelines for improved posture. Your physician or physical therapist will provide you with any instructions specific to your needs. While reading these guidelines, remember: °· The exercises prescribed by your provider will help you have the flexibility and strength to maintain correct postures. °· The correct posture provides the optimal environment for your joints to work. All of your joints have less wear and tear when properly supported by a spine with good posture. This means you will experience a healthier, less painful body. °· Correct posture must be practiced with all of your activities, especially prolonged sitting and standing. Correct posture is as important when doing repetitive low-stress activities (typing) as it is when doing a single heavy-load activity (lifting). °PROLONGED STANDING WHILE SLIGHTLY LEANING FORWARD °When completing a task that requires you to lean forward while standing in one place for a long time, place either foot up on a stationary 2-4 inch high object to help maintain the best posture. When both feet are on the ground, the low back tends to lose its slight inward curve. If this curve flattens (or becomes too large), then the back and your other joints will experience too much stress, fatigue  more quickly and can cause pain.  °RESTING POSITIONS °Consider which positions are most painful for you when choosing a resting position. If you have pain with flexion-based activities (sitting, bending, stooping, squatting), choose a position that allows you to rest in a less flexed posture. You would want to avoid curling into a fetal position on your side. If your pain worsens with extension-based activities (prolonged standing, working overhead), avoid resting in an extended position such as sleeping on your stomach. Most people will find more comfort when they rest with their spine in a more neutral position, neither too rounded nor too arched. Lying on a non-sagging bed on your side with a pillow between your knees, or on your back with a pillow under your knees will often provide some relief. Keep in mind, being in any one position for a prolonged period of time, no matter how correct your posture, can still lead to stiffness. °WALKING °Walk with an upright posture. Your ears, shoulders and hips should all line-up. °OFFICE WORK °When working at a desk, create an environment that supports good, upright posture. Without extra support, muscles fatigue and lead to excessive strain on joints and other tissues. °  CHAIR: °· A chair should be able to slide under your desk when your back makes contact with the back of the chair. This allows you to work closely. °· The chair's height should allow your eyes to be level with the upper part of your monitor and your hands to be slightly lower than your elbows. °· Body position: °· Your feet should make contact with the floor. If this is not possible, use a foot rest. °· Keep your ears over your shoulders. This will reduce stress on your neck and low back. °Document Released: 09/24/2005 Document Revised: 12/17/2011 Document Reviewed: 01/06/2009 °[employer]® Patient Information ©2013 [employer]. ° °Do exercises twice daily followed by moist heat for 15  minutes. ° ° ° ° ° °Try to be as active as possible. ° °If no better in 2 weeks, follow up with orthopedist. ° ° °

## 2014-05-19 ENCOUNTER — Ambulatory Visit: Payer: Self-pay | Admitting: Internal Medicine

## 2015-06-06 ENCOUNTER — Emergency Department (HOSPITAL_COMMUNITY)
Admission: EM | Admit: 2015-06-06 | Discharge: 2015-06-07 | Disposition: A | Discharge status: Home / Self Care | Payer: No Typology Code available for payment source | Attending: Emergency Medicine | Admitting: Emergency Medicine

## 2015-06-06 ENCOUNTER — Encounter (HOSPITAL_COMMUNITY): Payer: Self-pay | Admitting: *Deleted

## 2015-06-06 DIAGNOSIS — Z79899 Other long term (current) drug therapy: Secondary | ICD-10-CM | POA: Insufficient documentation

## 2015-06-06 DIAGNOSIS — Z8709 Personal history of other diseases of the respiratory system: Secondary | ICD-10-CM | POA: Diagnosis not present

## 2015-06-06 DIAGNOSIS — K047 Periapical abscess without sinus: Secondary | ICD-10-CM | POA: Diagnosis not present

## 2015-06-06 DIAGNOSIS — Z720 Tobacco use: Secondary | ICD-10-CM | POA: Insufficient documentation

## 2015-06-06 DIAGNOSIS — Z8619 Personal history of other infectious and parasitic diseases: Secondary | ICD-10-CM | POA: Diagnosis not present

## 2015-06-06 DIAGNOSIS — K088 Other specified disorders of teeth and supporting structures: Secondary | ICD-10-CM | POA: Diagnosis present

## 2015-06-06 NOTE — ED Notes (Signed)
Pt states that he has had right upper tooth pain for 2 days; pt reports swelling to rt side of face that improved after using antiseptic mouthwash but states that it came back this evening; pt with widespread dental decay

## 2015-06-07 MED ORDER — IBUPROFEN 800 MG PO TABS: 800.0000 mg | ORAL_TABLET | Freq: Three times a day (TID) | ORAL | Status: DC | PRN

## 2015-06-07 MED ORDER — PENICILLIN V POTASSIUM 500 MG PO TABS: 500.0000 mg | ORAL_TABLET | Freq: Three times a day (TID) | ORAL | Status: DC

## 2015-06-07 MED ORDER — HYDROCODONE-ACETAMINOPHEN 5-325 MG PO TABS: 1.0000 | ORAL_TABLET | Freq: Four times a day (QID) | ORAL | Status: DC | PRN

## 2015-06-07 MED ORDER — IBUPROFEN 800 MG PO TABS
800.0000 mg | ORAL_TABLET | Freq: Once | ORAL | Status: AC
  Administered 2015-06-07: 800 mg via ORAL
  Filled 2015-06-07: qty 1

## 2015-06-07 MED ORDER — PENICILLIN V POTASSIUM 500 MG PO TABS
500.0000 mg | ORAL_TABLET | Freq: Once | ORAL | Status: AC
  Administered 2015-06-07: 500 mg via ORAL
  Filled 2015-06-07: qty 1

## 2015-06-07 NOTE — ED Provider Notes (Signed)
CSN: [id number]     Arrival date & time 06/06/15  2338 History   First MD Initiated Contact with Patient 06/07/15 0026     Chief Complaint  Patient presents with  . Dental Pain     (Consider location/radiation/quality/duration/timing/severity/associated sxs/prior Treatment) HPI   34 year old male who presents for evaluation of dental pain. Patient complaining of right upper tooth pain for the past 2 days. Pain is described as sharp and throbbing sensation, persistent, worsening with chewing. State he notice swelling to the right side of his cheek with increased tenderness. No associated fever or chills, no vision changes, pain with eye movement, ear pain, or trouble swallowing. He did take a tramadol earlier without adequate relief. He did attempt to follow-up with a dentist but was told that he has to pay money upfront which he cannot afford. He also try to rinse mouth with antiseptic mouth wash but pain came back.  Past Medical History  Diagnosis Date  . Bronchitis   . TB (pulmonary tuberculosis)     treated when was 34 y/o    Past Surgical History  Procedure Laterality Date  . Tonsillectomy     No family history on file. Social History  Substance Use Topics  . Smoking status: Current Every Day Smoker -- 0.50 packs/day  . Smokeless tobacco: None  . Alcohol Use: Yes     Comment: 3 x/week    Review of Systems  Constitutional: Negative for fever.  HENT: Positive for dental problem and facial swelling.   Skin: Negative for rash.      Allergies  Review of patient's allergies indicates no known allergies.  Home Medications   Prior to Admission medications   Medication Sig Start Date End Date Taking? Authorizing Provider  HYDROcodone-acetaminophen (NORCO/VICODIN) 5-325 MG per tablet Take 1-2 tablets by mouth every 6 (six) hours as needed for moderate pain or severe pain. 05/09/14   Antony Madura, PA-C  ibuprofen (ADVIL,MOTRIN) 800 MG tablet Take 1 tablet (800 mg total) by  mouth every 8 (eight) hours as needed for mild pain or moderate pain. 01/18/14   Trixie Dredge, PA-C  meloxicam (MOBIC) 15 MG tablet Take 1 tablet (15 mg total) by mouth daily. 05/12/14   Reuben Likes, MD  methocarbamol (ROBAXIN) 500 MG tablet Take 1 tablet (500 mg total) by mouth 2 (two) times daily. 05/09/14   Antony Madura, PA-C  methocarbamol (ROBAXIN) 500 MG tablet Take 1 tablet (500 mg total) by mouth 3 (three) times daily. 05/12/14   Reuben Likes, MD  naproxen (NAPROSYN) 500 MG tablet Take 1 tablet (500 mg total) by mouth 2 (two) times daily. 05/09/14   Antony Madura, PA-C  oxyCODONE-acetaminophen (PERCOCET) 5-325 MG per tablet 1 to 2 tablets every 6 hours as needed for pain. 05/12/14   Reuben Likes, MD   BP 124/77 mmHg  Pulse 77  Temp(Src) 98.6 F (37 C) (Oral)  Resp 18  SpO2 100% Physical Exam  Constitutional: He appears well-developed and well-nourished. No distress.  HENT:  Head: Atraumatic.  Mouth with widespread dental decay. Tenderness along the right upper gumline. Induration noted to his right zygomatic arch and right buccal region. No facial erythema and no eye involvement. No orbital cellulitis. No trismus.  Eyes: Conjunctivae are normal.  Neck: Neck supple.  Cardiovascular: Normal rate and regular rhythm.   Pulmonary/Chest: Effort normal and breath sounds normal.  Lymphadenopathy:    He has no cervical adenopathy.  Neurological: He is alert.  Skin: No rash noted.  Psychiatric: He has a normal mood and affect.  Nursing note and vitals reviewed.   ED Course  Procedures (including critical care time)  Patient with widespread dental decay, and swelling to his right buccal region and right zygomatic arch suggestive of periapical abscess. Patient is afebrile with stable normal vital sign. No eye involvement. No evidence to suggest orbital cellulitis. Patient will be given antibiotic, and he will need to follow-up with either a dentist or oral surgeon for further management of his  condition. Referral given.    MDM   Final diagnoses:  Dental abscess    BP 124/77 mmHg  Pulse 77  Temp(Src) 98.6 F (37 C) (Oral)  Resp 18  SpO2 100%     Fayrene Helper, PA-C 06/07/15 1324  Tomasita Crumble, MD 06/07/15 4010

## 2015-06-07 NOTE — Discharge Instructions (Signed)

## 2016-09-28 ENCOUNTER — Encounter (HOSPITAL_COMMUNITY): Payer: Self-pay | Admitting: Emergency Medicine

## 2016-09-28 ENCOUNTER — Emergency Department (HOSPITAL_COMMUNITY)
Admission: EM | Admit: 2016-09-28 | Discharge: 2016-09-28 | Disposition: A | Discharge status: Home / Self Care | Payer: No Typology Code available for payment source | Attending: Emergency Medicine | Admitting: Emergency Medicine

## 2016-09-28 ENCOUNTER — Emergency Department (HOSPITAL_COMMUNITY): Payer: No Typology Code available for payment source

## 2016-09-28 DIAGNOSIS — Y999 Unspecified external cause status: Secondary | ICD-10-CM | POA: Insufficient documentation

## 2016-09-28 DIAGNOSIS — Z23 Encounter for immunization: Secondary | ICD-10-CM | POA: Insufficient documentation

## 2016-09-28 DIAGNOSIS — S0101XA Laceration without foreign body of scalp, initial encounter: Secondary | ICD-10-CM | POA: Insufficient documentation

## 2016-09-28 DIAGNOSIS — F17200 Nicotine dependence, unspecified, uncomplicated: Secondary | ICD-10-CM | POA: Insufficient documentation

## 2016-09-28 DIAGNOSIS — Y929 Unspecified place or not applicable: Secondary | ICD-10-CM | POA: Insufficient documentation

## 2016-09-28 DIAGNOSIS — Y939 Activity, unspecified: Secondary | ICD-10-CM | POA: Insufficient documentation

## 2016-09-28 DIAGNOSIS — T148XXA Other injury of unspecified body region, initial encounter: Secondary | ICD-10-CM

## 2016-09-28 DIAGNOSIS — S0181XA Laceration without foreign body of other part of head, initial encounter: Secondary | ICD-10-CM

## 2016-09-28 MED ORDER — TRAMADOL HCL 50 MG PO TABS: 50.0000 mg | ORAL_TABLET | Freq: Four times a day (QID) | ORAL | 0 refills | Status: AC | PRN

## 2016-09-28 MED ORDER — OXYCODONE-ACETAMINOPHEN 5-325 MG PO TABS
1.0000 | ORAL_TABLET | Freq: Once | ORAL | Status: AC
  Administered 2016-09-28: 1 via ORAL
  Filled 2016-09-28: qty 1

## 2016-09-28 MED ORDER — IBUPROFEN 800 MG PO TABS: 800.0000 mg | ORAL_TABLET | Freq: Three times a day (TID) | ORAL | 0 refills | Status: AC

## 2016-09-28 MED ORDER — TETANUS-DIPHTH-ACELL PERTUSSIS 5-2.5-18.5 LF-MCG/0.5 IM SUSP
0.5000 mL | Freq: Once | INTRAMUSCULAR | Status: AC
  Administered 2016-09-28: 0.5 mL via INTRAMUSCULAR
  Filled 2016-09-28: qty 0.5

## 2016-09-28 NOTE — ED Notes (Signed)
Patient transported to CT 
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
## 2017-12-19 ENCOUNTER — Other Ambulatory Visit: Payer: Self-pay

## 2017-12-19 ENCOUNTER — Emergency Department (HOSPITAL_COMMUNITY): Payer: No Typology Code available for payment source

## 2017-12-19 ENCOUNTER — Emergency Department (HOSPITAL_COMMUNITY)
Admission: EM | Admit: 2017-12-19 | Discharge: 2017-12-19 | Disposition: A | Discharge status: Home / Self Care | Payer: No Typology Code available for payment source | Attending: Emergency Medicine | Admitting: Emergency Medicine

## 2017-12-19 ENCOUNTER — Encounter (HOSPITAL_COMMUNITY): Payer: Self-pay

## 2017-12-19 DIAGNOSIS — S92345A Nondisplaced fracture of fourth metatarsal bone, left foot, initial encounter for closed fracture: Secondary | ICD-10-CM | POA: Insufficient documentation

## 2017-12-19 DIAGNOSIS — Y92410 Unspecified street and highway as the place of occurrence of the external cause: Secondary | ICD-10-CM | POA: Insufficient documentation

## 2017-12-19 DIAGNOSIS — Y998 Other external cause status: Secondary | ICD-10-CM | POA: Insufficient documentation

## 2017-12-19 DIAGNOSIS — S92354A Nondisplaced fracture of fifth metatarsal bone, right foot, initial encounter for closed fracture: Secondary | ICD-10-CM | POA: Insufficient documentation

## 2017-12-19 DIAGNOSIS — Y9389 Activity, other specified: Secondary | ICD-10-CM | POA: Insufficient documentation

## 2017-12-19 DIAGNOSIS — F17200 Nicotine dependence, unspecified, uncomplicated: Secondary | ICD-10-CM | POA: Insufficient documentation

## 2017-12-19 DIAGNOSIS — S92355A Nondisplaced fracture of fifth metatarsal bone, left foot, initial encounter for closed fracture: Secondary | ICD-10-CM | POA: Insufficient documentation

## 2017-12-19 DIAGNOSIS — S99922A Unspecified injury of left foot, initial encounter: Secondary | ICD-10-CM | POA: Diagnosis present

## 2017-12-19 MED ORDER — OXYCODONE-ACETAMINOPHEN 5-325 MG PO TABS
2.0000 | ORAL_TABLET | Freq: Once | ORAL | Status: AC
  Administered 2017-12-19: 2 via ORAL
  Filled 2017-12-19: qty 2

## 2017-12-19 MED ORDER — OXYCODONE-ACETAMINOPHEN 5-325 MG PO TABS: 1.0000 | ORAL_TABLET | ORAL | 0 refills | Status: DC | PRN

## 2017-12-19 NOTE — Discharge Instructions (Signed)
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  Please use the boots until he can be seen by the bone doctor.  Please call and schedule an appointment with the bone doctor (Dr. Charlann Boxerlin), information is highlighted below.  You can take Percocet to help manage pain.  This medication can make you drowsy so please do not drive or work while taking it.  Please also ice both of the feet to help with the swelling and elevate them when you can.  X-ray of your chest was reassuring, no broken bones.  Please return to the emergency department if you have any new or concerning symptoms.
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
## 2017-12-24 ENCOUNTER — Ambulatory Visit (INDEPENDENT_AMBULATORY_CARE_PROVIDER_SITE_OTHER): Payer: Self-pay | Admitting: Orthopaedic Surgery

## 2017-12-24 ENCOUNTER — Encounter (INDEPENDENT_AMBULATORY_CARE_PROVIDER_SITE_OTHER): Payer: Self-pay | Admitting: Orthopaedic Surgery

## 2017-12-24 DIAGNOSIS — S92345A Nondisplaced fracture of fourth metatarsal bone, left foot, initial encounter for closed fracture: Secondary | ICD-10-CM | POA: Insufficient documentation

## 2017-12-24 DIAGNOSIS — S92355A Nondisplaced fracture of fifth metatarsal bone, left foot, initial encounter for closed fracture: Secondary | ICD-10-CM | POA: Insufficient documentation

## 2017-12-24 DIAGNOSIS — S92354A Nondisplaced fracture of fifth metatarsal bone, right foot, initial encounter for closed fracture: Secondary | ICD-10-CM

## 2017-12-24 MED ORDER — OXYCODONE HCL 5 MG PO TABS: 5.0000 mg | ORAL_TABLET | Freq: Three times a day (TID) | ORAL | 0 refills | Status: DC | PRN

## 2017-12-24 MED ORDER — OXYCODONE HCL 10 MG PO TABS: ORAL_TABLET | ORAL | 0 refills | Status: AC
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
## 2021-09-05 ENCOUNTER — Emergency Department (HOSPITAL_COMMUNITY)
Admission: EM | Admit: 2021-09-05 | Discharge: 2021-09-05 | Disposition: A | Discharge status: Home / Self Care | Payer: No Typology Code available for payment source | Attending: Emergency Medicine | Admitting: Emergency Medicine

## 2021-09-05 ENCOUNTER — Encounter (HOSPITAL_COMMUNITY): Payer: Self-pay | Admitting: Emergency Medicine

## 2021-09-05 DIAGNOSIS — S3992XA Unspecified injury of lower back, initial encounter: Secondary | ICD-10-CM | POA: Diagnosis not present

## 2021-09-05 DIAGNOSIS — F17210 Nicotine dependence, cigarettes, uncomplicated: Secondary | ICD-10-CM | POA: Insufficient documentation

## 2021-09-05 DIAGNOSIS — Y92410 Unspecified street and highway as the place of occurrence of the external cause: Secondary | ICD-10-CM | POA: Insufficient documentation

## 2021-09-05 MED ORDER — CYCLOBENZAPRINE HCL 10 MG PO TABS: 10.0000 mg | ORAL_TABLET | Freq: Two times a day (BID) | ORAL | 0 refills | Status: AC | PRN
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
## 2021-09-05 NOTE — Discharge Instructions (Addendum)
Return for any problem.  Continue to use Ibuprofen as instructed.
